# Patient Record
Sex: Male | Born: 2005 | Race: Black or African American | Hispanic: No | Marital: Single
Health system: Midwestern US, Community
[De-identification: ages and names within clinical notes are randomized; demographics above are authoritative.]

## PROBLEM LIST (undated history)

## (undated) DIAGNOSIS — M5416 Radiculopathy, lumbar region: Secondary | ICD-10-CM

## (undated) DIAGNOSIS — M5116 Intervertebral disc disorders with radiculopathy, lumbar region: Secondary | ICD-10-CM

## (undated) DIAGNOSIS — R5600 Simple febrile convulsions: Secondary | ICD-10-CM

---
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
## 2023-02-13 NOTE — Progress Notes (Signed)
CARE TEAM:  Patient Care Team:  Wilms, Isca, MD as PCP - General (Pediatrics)    ASSESSMENT and PLAN  1. Lumbar spine pain       There is no problem list on file for this patient.    Impression/Plan:  17 year old male with a right lower extremity radiculopathy for 8 months who has had 2 epidural steroid injections with temporary relief for 1-2 days at most.  He continues to have pain shooting down the right lower extremity.  He was unable to participate in the activities that he used to do such as basketball without exacerbating his pain in his lower extremities.  His physical examination is consistent with nerve tension signs in the right lower extremity which also correlates to S1 distribution on the right as well as perhaps L5 distribution on the right.  Given his attempts at conservative management and duration of his symptoms, we will move ahead with planning for right-sided L4-5 and L5-S1 microdiskectomy.  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  I think ignoring the L4-5 level would be inappropriate in his setting.      Orders Placed:   Orders Placed This Encounter   . X-ray lumbar spine complete 4+ views [id number])      Follow-up: No follow-ups on file.    HISTORY OF PRESENT ILLNESS  Chief Complaint: Pain of the Lower Back   Age: 17 y.o.  Sex: male     Previous Spine Injections:  12/29/2022: L4-5 IL ESI: 90% relief for 1-2 days  11016/2023:  Right-sided L4-5, L5-S1 TF ESI: 90% relief for 1-2 days    Prior Spine Surgeries:   None    History of present illness: Mr. Giroux presents today for evaluation of back pain and right lower extremity radiculopathy.  He says these symptoms began when he was lifting weights back in April of 2023.  He noticed at that time immediate onset pain down the right lower extremity.  He was had 2 rounds of epidural steroid injections as described  above each of which provided excellent but very short term pain relief.  He has had extensive physical therapy over the last 8 months without any persistent improvement in his discomfort.  Takes Tylenol and ibuprofen and has had oral steroids without any long-term improvement in his symptoms. He does not have any specific weakness to speak of. The patient denies saddle anesthesia as well as loss of dexterity. The patient denies gait instability.         OBJECTIVE  Constitutional:  No acute distress. His body mass index is 18.82 kg/m.   Respiratory:  No labored breathing.  Cardiovascular:  No marked edema.  Psychiatric: Alert and oriented x3.      Physical Exam   General appearance: Well-groomed  Body Habitus: Underweight  Psych: Alert, Oriented and Pleasant  Respiratory Exam: Non-labored breathing  Cardiovascular Exam: Extremities warm and well perfused  Right tibial pulse: Normal  Left tibial pulse: Normal    Lumbar Exam    Skin: Normal  Cervical posture: Normal  Thoracic posture: Normal  Lumbar Posture: Normal  Pelvic Tilt: Normal  Sagittal Balance: Normal  Coronal Balance: Normal  Lumbar Flexion: Moderately limited  Lumbar Extension: Mildly limited  Painful lumbar range of motion: Not noted  Left Hip Motion: No pain with internal rotation and No pain with external rotation  Right Hip Motion: No  pain with internal rotation and No pain with external rotation  Lumbar Point of Tenderness: No Tenderness Noted  Thoracic Tenderness Location: Not Noted    Neurologic Exam   BLE Motor: Normal  R Hip Flexion: 5/5  L Hip Flexion: 5/5  R Knee Extension: 5/5  L Knee Extension: 5/5  R Ankle Dorsiflexion: 5/5  L Ankle Dorsiflexion: 5/5  R Great Toe Extension: 5/5  L Great Toe Extension: 5/5  R Ankle Plantar Flexion: 5/5  L Ankle Plantar Flexion: 5/5  R Ankle Inversion: 5/5  L Ankle Inversion: 5/5  R Ankle Eversion: 5/5  L Ankle Eversion: 5/5    BLE Sensory: Normal  R Lateral Thigh: Normal  L Lateral Thigh: Normal  R Anterior  Thigh: Normal  L Anterior thigh: Normal  R Shin: Normal  L Shin: Normal  R Calf: Normal  L Calf: Normal  R Dorsal Foot: Normal  L Dorsal Foot: Normal  R Lateral Foot: Normal  L Lateral Foot: Normal  R Plantar Foot: Normal  L Plantar Foot: Normal  R Toes: Normal  L Toes: Normal    BLE Reflexes: Normal  R Patellar Reflex: 2+  L Patellar Reflex: 2+  R Achilles Reflex: 1+  L Achilles Reflex: 1+  R Ankle Jerk:  Normal  L Ankle Jerk: Normal     Right straight leg raise positive   Left straight leg raise negative  R Contralateral SLR: Straightening of the right leg causes left leg pain   L Contralateral SLR: Straightening of the left leg does not cause right leg pain     Gait Evaluation  Gait: Normal  Right-sided gait: Normal  Left-sided gait: Normal  Toe raises: Both sides  Heel-Standing: Both sides  Tandem Walking:Normal  Assistive Device: None      IMAGING / DIAGNOSTIC STUDIES   Order: XR SPINE LUMBAR COMPLETE 4+ VW - Indication: Lumbar spine pain      X-ray lumbar spine complete 4+ views [id number])    Result Date: 02/13/2023  AP, Lat, Flexion, Extension.     Impression: AP lateral flexion-extension lumbar spine radiographs demonstrate stable appearance of the lumbar spine.  He does have relatively low pelvic incidence and appropriately low curvature and lordosis of the lumbar spine.  There was no obvious pars defect or evidence of instability on his radiographs.     Additional Imaging Reviewed:  MRI of the lumbar spine from October 04, 2022 was also reviewed:  This demonstrates disc desiccation and disc height loss most significant at L4-5 and L5-S1.  At the L4-5 level, he has right-sided paracentral disc herniation contributing to moderate right-sided lateral recess stenosis adjacent to the traversing L5 nerve root.  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    PROCEDURES  Procedures      Zollie Pee, MD

## 2023-02-13 NOTE — H&P (View-Only) (Signed)
CARE TEAM:  Patient Care Team:  Wilms, Isca, MD as PCP - General (Pediatrics)    ASSESSMENT and PLAN  1. Lumbar spine pain       There is no problem list on file for this patient.    Impression/Plan:  17 year old male with a right lower extremity radiculopathy for 8 months who has had 2 epidural steroid injections with temporary relief for 1-2 days at most.  He continues to have pain shooting down the right lower extremity.  He was unable to participate in the activities that he used to do such as basketball without exacerbating his pain in his lower extremities.  His physical examination is consistent with nerve tension signs in the right lower extremity which also correlates to S1 distribution on the right as well as perhaps L5 distribution on the right.  Given his attempts at conservative management and duration of his symptoms, we will move ahead with planning for right-sided L4-5 and L5-S1 microdiskectomy.  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  I think ignoring the L4-5 level would be inappropriate in his setting.      Orders Placed:   Orders Placed This Encounter   . X-ray lumbar spine complete 4+ views [id number])      Follow-up: No follow-ups on file.    HISTORY OF PRESENT ILLNESS  Chief Complaint: Pain of the Lower Back   Age: 17 y.o.  Sex: male     Previous Spine Injections:  12/29/2022: L4-5 IL ESI: 90% relief for 1-2 days  11016/2023:  Right-sided L4-5, L5-S1 TF ESI: 90% relief for 1-2 days    Prior Spine Surgeries:   None    History of present illness: Mr. Patrick George presents today for evaluation of back pain and right lower extremity radiculopathy.  He says these symptoms began when he was lifting weights back in April of 2023.  He noticed at that time immediate onset pain down the right lower extremity.  He was had 2 rounds of epidural steroid injections as described  above each of which provided excellent but very short term pain relief.  He has had extensive physical therapy over the last 8 months without any persistent improvement in his discomfort.  Takes Tylenol and ibuprofen and has had oral steroids without any long-term improvement in his symptoms. He does not have any specific weakness to speak of. The patient denies saddle anesthesia as well as loss of dexterity. The patient denies gait instability.         OBJECTIVE  Constitutional:  No acute distress. His body mass index is 18.82 kg/m.   Respiratory:  No labored breathing.  Cardiovascular:  No marked edema.  Psychiatric: Alert and oriented x3.      Physical Exam   General appearance: Well-groomed  Body Habitus: Underweight  Psych: Alert, Oriented and Pleasant  Respiratory Exam: Non-labored breathing  Cardiovascular Exam: Extremities warm and well perfused  Right tibial pulse: Normal  Left tibial pulse: Normal    Lumbar Exam    Skin: Normal  Cervical posture: Normal  Thoracic posture: Normal  Lumbar Posture: Normal  Pelvic Tilt: Normal  Sagittal Balance: Normal  Coronal Balance: Normal  Lumbar Flexion: Moderately limited  Lumbar Extension: Mildly limited  Painful lumbar range of motion: Not noted  Left Hip Motion: No pain with internal rotation and No pain with external rotation  Right Hip Motion: No  pain with internal rotation and No pain with external rotation  Lumbar Point of Tenderness: No Tenderness Noted  Thoracic Tenderness Location: Not Noted    Neurologic Exam   BLE Motor: Normal  R Hip Flexion: 5/5  L Hip Flexion: 5/5  R Knee Extension: 5/5  L Knee Extension: 5/5  R Ankle Dorsiflexion: 5/5  L Ankle Dorsiflexion: 5/5  R Great Toe Extension: 5/5  L Great Toe Extension: 5/5  R Ankle Plantar Flexion: 5/5  L Ankle Plantar Flexion: 5/5  R Ankle Inversion: 5/5  L Ankle Inversion: 5/5  R Ankle Eversion: 5/5  L Ankle Eversion: 5/5    BLE Sensory: Normal  R Lateral Thigh: Normal  L Lateral Thigh: Normal  R Anterior  Thigh: Normal  L Anterior thigh: Normal  R Shin: Normal  L Shin: Normal  R Calf: Normal  L Calf: Normal  R Dorsal Foot: Normal  L Dorsal Foot: Normal  R Lateral Foot: Normal  L Lateral Foot: Normal  R Plantar Foot: Normal  L Plantar Foot: Normal  R Toes: Normal  L Toes: Normal    BLE Reflexes: Normal  R Patellar Reflex: 2+  L Patellar Reflex: 2+  R Achilles Reflex: 1+  L Achilles Reflex: 1+  R Ankle Jerk:  Normal  L Ankle Jerk: Normal     Right straight leg raise positive   Left straight leg raise negative  R Contralateral SLR: Straightening of the right leg causes left leg pain   L Contralateral SLR: Straightening of the left leg does not cause right leg pain     Gait Evaluation  Gait: Normal  Right-sided gait: Normal  Left-sided gait: Normal  Toe raises: Both sides  Heel-Standing: Both sides  Tandem Walking:Normal  Assistive Device: None      IMAGING / DIAGNOSTIC STUDIES   Order: XR SPINE LUMBAR COMPLETE 4+ VW - Indication: Lumbar spine pain      X-ray lumbar spine complete 4+ views [id number])    Result Date: 02/13/2023  AP, Lat, Flexion, Extension.     Impression: AP lateral flexion-extension lumbar spine radiographs demonstrate stable appearance of the lumbar spine.  He does have relatively low pelvic incidence and appropriately low curvature and lordosis of the lumbar spine.  There was no obvious pars defect or evidence of instability on his radiographs.     Additional Imaging Reviewed:  MRI of the lumbar spine from October 04, 2022 was also reviewed:  This demonstrates disc desiccation and disc height loss most significant at L4-5 and L5-S1.  At the L4-5 level, he has right-sided paracentral disc herniation contributing to moderate right-sided lateral recess stenosis adjacent to the traversing L5 nerve root.  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    PROCEDURES  Procedures      Zollie Pee, MD

## 2023-02-15 ENCOUNTER — Inpatient Hospital Stay: Admit: 2023-02-15 | Discharge: 2023-02-20 | Payer: MEDICAID

## 2023-02-15 DIAGNOSIS — Z01818 Encounter for other preprocedural examination: Secondary | ICD-10-CM

## 2023-02-15 LAB — URINALYSIS WITH REFLEX TO CULTURE
BACTERIA, URINE: NEGATIVE /hpf
Bilirubin Urine: NEGATIVE
Blood, Urine: NEGATIVE
Glucose, UA: NEGATIVE mg/dL
Leukocyte Esterase, Urine: NEGATIVE
Nitrite, Urine: NEGATIVE
Protein, UA: NEGATIVE mg/dL
Specific Gravity, UA: 1.03 (ref 1.003–1.030)
Urobilinogen, Urine: 1 EU/dL (ref 0.2–1.0)
pH, Urine: 5.5 (ref 5.0–8.0)

## 2023-02-15 LAB — CBC WITH AUTO DIFFERENTIAL
Absolute Immature Granulocyte: 0 10*3/uL (ref 0.00–0.04)
Basophils %: 1 % (ref 0–1)
Basophils Absolute: 0 10*3/uL (ref 0.0–0.1)
Eosinophils %: 3 % (ref 0–7)
Eosinophils Absolute: 0.2 10*3/uL (ref 0.0–0.4)
Hematocrit: 47 % (ref 36.6–50.3)
Hemoglobin: 16.3 g/dL (ref 12.1–17.0)
Immature Granulocytes: 0 % (ref 0.0–0.5)
Lymphocytes %: 54 % — ABNORMAL HIGH (ref 12–49)
Lymphocytes Absolute: 3.5 10*3/uL (ref 0.8–3.5)
MCH: 30.1 PG (ref 26.0–34.0)
MCHC: 34.7 g/dL (ref 30.0–36.5)
MCV: 86.9 FL (ref 80.0–99.0)
MPV: 9.1 FL (ref 8.9–12.9)
Monocytes %: 7 % (ref 5–13)
Monocytes Absolute: 0.4 10*3/uL (ref 0.0–1.0)
Neutrophils %: 35 % (ref 32–75)
Neutrophils Absolute: 2.2 10*3/uL (ref 1.8–8.0)
Nucleated RBCs: 0 PER 100 WBC
Platelets: 300 10*3/uL (ref 150–400)
RBC: 5.41 M/uL (ref 4.10–5.70)
RDW: 12.7 % (ref 11.5–14.5)
WBC: 6.4 10*3/uL (ref 4.1–11.1)
nRBC: 0 10*3/uL (ref 0.00–0.01)

## 2023-02-15 LAB — COMPREHENSIVE METABOLIC PANEL
ALT: 15 U/L (ref 12–78)
AST: 13 U/L — ABNORMAL LOW (ref 15–37)
Albumin/Globulin Ratio: 1.1 (ref 1.1–2.2)
Albumin: 3.9 g/dL (ref 3.5–5.0)
Alk Phosphatase: 72 U/L (ref 60–330)
Anion Gap: 2 mmol/L — ABNORMAL LOW (ref 5–15)
BUN: 13 MG/DL (ref 6–20)
Bun/Cre Ratio: 11 — ABNORMAL LOW (ref 12–20)
CO2: 30 mmol/L (ref 21–32)
Calcium: 9.2 MG/DL (ref 8.5–10.1)
Chloride: 108 mmol/L (ref 97–108)
Creatinine: 1.23 MG/DL (ref 0.70–1.30)
Est, Glom Filt Rate: >60 mL/min/{1.73_m2} (ref >60)
Globulin: 3.4 g/dL (ref 2.0–4.0)
Glucose: 94 mg/dL (ref 65–100)
Potassium: 4.2 mmol/L (ref 3.5–5.1)
Sodium: 140 mmol/L (ref 136–145)
Total Bilirubin: 1 MG/DL (ref 0.2–1.0)
Total Protein: 7.3 g/dL (ref 6.4–8.2)

## 2023-02-15 LAB — PROTIME-INR
INR: 1 (ref 0.9–1.1)
Protime: 10.9 s (ref 9.0–11.1)

## 2023-02-15 LAB — APTT: APTT: 28.1 s (ref 22.1–31.0)

## 2023-02-15 NOTE — H&P (Cosign Needed)
Patrick George was referred for evaluation by:Dr. Gayla Medicus for Pre- Op Evaluation.  Please see encounter details and orders for consultative summary.    Type of surgery : Right L4-L5, L5-S1 Microdiscectomy  Surgery site : Lumbar spine  Anesthesia type: General  Date of procedure:  02/21/2023    This 17 y.o. year old male presents with complaints of lower back pian with radiation of pain down right leg since 04/2022.  Symptoms began after he was lifting weights and has waxed and waned in intensity since then. MRI showed disc herniation and stenosis at L4-L5 and L5-S1.  Conservative measures including medication management, activity modification, physical therapy and injection therapy have been exhausted prior to the decision for surgery. Patient has discussed the risks, alternatives, and benefits of the surgery with surgeon and has elected to proceed with surgical intervention.    PCP: Dr Carnella Guadalajara Wilms    Allergies: No Known Allergies  Latex allergy: no  Prior reactions to anesthesia:  None     No current outpatient medications on file.     No current facility-administered medications for this encounter.     Past Medical History:   Diagnosis Date    Asthma     childhood asthma    History of anesthesia complications     mother more difficult to sedate    Lumbar radiculitis      Past Surgical History:   Procedure Laterality Date    KNEE ARTHROSCOPY W/ MENISCECTOMY Right 08/2021    WISDOM TOOTH EXTRACTION       Social History     Tobacco Use    Smoking status: Never    Smokeless tobacco: Never   Vaping Use    Vaping Use: Some days    Substances: Nicotine, Flavoring    Devices: Disposable, Pre-filled or refillable cartridge   Substance Use Topics    Alcohol use: Yes     Comment: occasional    Drug use: Yes     Frequency: 7.0 times per week     Types: Marijuana Sheran Fava)     History reviewed. No pertinent family history.     Pre-Operative Risk Assessment Using 2014 ACC/AHA Guidelines     Emergent procedure: No  Active Cardiac  Condition including decompensated HF, Arrhythmia, MI <3 weeks, severe valve disease: No  Risk Level of Procedure:  intermediate risk  Revised Cardiac Risk Index Risk factors: None 3.9% risk of cardiac complications during or around noncardiac surgery (30 day risk of Death, MI or Cardiac arrest).  Measurement of Exercise Tolerance before Surgery >4 METS (climbing > 1 flight of stairs without stopping, walking up hill > 1-2 blocks, scrubbing floors, moving furniture, golf, bowling, dancing or tennis, or running short distance): Yes    History of cardiac (including arrhythmic or valvular) or lung issues? No  Personal or family of bleeding issues?  No  Hx of HTN?  No        Controlled?  N/A     Smoker? No  Etoh or other substance use?  occasional shot of whiskey; smokes cannabis daily     On anticoagulation, insulin, or steroids?  No  Any concern with current medications?  No  Pacemaker present? No        and if so, has it been interrogated in the last 12 months?  N/A  Known OSA?  No   Known liver disease?  No      Blood pressure 115/70, pulse 72, temperature 97.5 F (36.4 C), temperature source  Oral, resp. rate 20, height 1.93 m (6\' 4" ), weight 69.6 kg (153 lb 8 oz), SpO2 100 %.    Review of Systems  Review of Systems   Constitutional:  Negative for chills, fatigue and fever.   HENT:  Negative for congestion, hearing loss, sore throat and trouble swallowing.    Eyes:  Negative for visual disturbance.   Respiratory:  Negative for cough, shortness of breath and wheezing.    Cardiovascular:  Negative for chest pain, palpitations and leg swelling.   Gastrointestinal:  Negative for abdominal pain, blood in stool, nausea and vomiting.   Genitourinary:  Negative for dysuria, frequency and urgency.   Musculoskeletal:  Positive for back pain.   Skin:  Negative for rash and wound.   Neurological:  Positive for weakness (right leg) and numbness (right leg). Negative for dizziness, light-headedness and headaches.    Psychiatric/Behavioral:  Negative for dysphoric mood. The patient is not nervous/anxious.           Physical Exam  Vitals and nursing note reviewed. Exam conducted with a chaperone present.   Constitutional:       Appearance: Normal appearance.   HENT:      Head: Normocephalic and atraumatic.      Right Ear: External ear normal.      Left Ear: External ear normal.      Nose: Nose normal.      Mouth/Throat:      Mouth: Mucous membranes are moist.      Pharynx: Oropharynx is clear.   Eyes:      Extraocular Movements: Extraocular movements intact.   Cardiovascular:      Rate and Rhythm: Normal rate and regular rhythm.      Pulses: Normal pulses.      Heart sounds: Normal heart sounds.   Pulmonary:      Effort: Pulmonary effort is normal.      Breath sounds: Normal breath sounds. No wheezing or rhonchi.   Abdominal:      General: Bowel sounds are normal.      Palpations: Abdomen is soft.      Tenderness: There is no abdominal tenderness.   Musculoskeletal:      Cervical back: Normal range of motion and neck supple.      Lumbar back: Decreased range of motion.   Skin:     General: Skin is warm and dry.      Findings: No lesion or rash.   Neurological:      General: No focal deficit present.      Mental Status: He is alert and oriented to person, place, and time.      Motor: Weakness present.      Comments: Right leg weakness noted   Psychiatric:         Mood and Affect: Mood normal.         Behavior: Behavior normal.       Assessment  Lumbar disc Herniation  Preoperative evaluation    Plan  Labs and EKG pending  Plan for Right L4-L5, L5-S1 Microdiscectomy    The purpose of this visit is for preoperative history and physical and does not imply medical clearance for surgical procedure.  Additional clearance from specialists may be required based on findings from this examination.    According to the 2014 ACC/AHA pre-operative risk assessment guidelines Oleh Keath is at low risk for major cardiac complications during  a intermediate procedure, exercise tolerance is >4 METS.    Request further recommendations  from consultants: None

## 2023-02-15 NOTE — Other (Signed)
Clarification requested regarding patient's surgery. Surgical posting and surgical consent state "R L4-L5". Left message for surgeon's office to clarify.

## 2023-02-15 NOTE — Other (Signed)
[place] [address]   MAIN OR                                  [id number]   MAIN PRE OP                          [id number]                                                                                AMBULATORY PRE OP          [phone]  PRE-ADMISSION TESTING    [phone]     Surgery Date:  Tuesday 02/21/2023       Is surgery arrival time given by surgeon?    If "NO", [facility] staff will call you between 3 and 7pm the day before your surgery with your arrival time.   Call [phone] after 7pm Monday-Friday if you did not receive this call.    INSTRUCTIONS BEFORE YOUR SURGERY   When You  Arrive Free valet parking is available from 7:00 AM - 5:00 PM.  Arrive at the [facility] [facility] on the day of your surgery  Have your insurance card, photo ID, and any copayment (if needed)   Food   and   Drink NO food or drink after midnight the night before surgery    This means NO water, gum, mints, coffee, juice, etc.  No alcohol (beer, wine, liquor) 24 hours before and after surgery   Medications to   TAKE   Morning of Surgery MEDICATIONS TO TAKE THE MORNING OF SURGERY WITH A SIP OF WATER:     N/A    Tylenol may be taken as needed.   Medications  To  STOP      7 days before surgery Non-Steroidal anti-inflammatory Drugs (NSAID's): for example, Ibuprofen (Advil, Motrin), Naproxen (Aleve)  Aspirin, if taking for pain   Herbal supplements, vitamins, and fish oil  Other:     Blood  Thinners If you take  Aspirin, Plavix, Coumadin, or any blood-thinning or anti-blood clot medicine, talk to the doctor who prescribed the medications for pre-operative instructions.   Bathing Clothing  Jewelry  Valuables     If you shower the morning of surgery, please do not apply anything to your skin (lotions, powders, deodorant, or makeup, especially mascara)  Follow Chlorhexidine [employer] body wash instructions provided to you during PAT  appointment. Begin 3 days prior to surgery.  Do not shave or trim anywhere 24 hours before surgery  Wear your hair loose or down; no pony-tails, buns, or metal hair clips  Wear loose, comfortable, clean clothes  Wear glasses instead of contacts  Leave money, valuables, and jewelry, including body piercings, at home  If you were given an [employer], bring it on day of surgery.   Going Home - or Spending the Night   SAME-DAY SURGERY:  You must have a responsible adult drive you home and stay with you 24 hours after surgery    ADMITS: If your doctor is keeping you in the hospital after surgery, leave personal belongings/luggage in your car until you have a hospital room number.       Special Instructions Watch online Spine Surgery education video.    If your physical condition changes (like a fever, cold, flu, etc.) call your surgeon.  If a situation occurs and you are delayed the day of surgery, call [phone].         Follow all instructions so your surgery won't be cancelled.  Please, be on time.                  Home medication(s) reviewed and verified verbally with list during PAT appointment.

## 2023-02-15 NOTE — Other (Signed)
Vitamin D was low - 10.1, per Luane School, NP, instructions were given to Mother of Patrick George to get OTC Vitamin D 5,000 units, take two capsules daily for 30 days.  Mom verbalized understanding.   I did tell her that the MRSA result is not posted yet, and that if that is positive, then we would be calling her back to treat that, but since his surgery is in the near future, I wanted [name] to get started with the Vit. D supplement.      I also sent the result of Vit D to Dr. Doy Mince and Dr. Lavella Lemons Wilms.    The EKG was somewhat abnormal, that was printed and faxed to Dr. Wilms for future information/patient chart.  Luane School, NP was aware that I was sending EKG to patient's PCP.

## 2023-02-16 LAB — HEMOGLOBIN A1C
Estimated Avg Glucose: 105 mg/dL
Hemoglobin A1C: 5.3 % (ref 4.0–5.6)

## 2023-02-16 LAB — EKG 12-LEAD
Atrial Rate: 56 {beats}/min
P Axis: 70 degrees
P-R Interval: 216 ms
Q-T Interval: 404 ms
QRS Duration: 94 ms
QTc Calculation (Bazett): 389 ms
R Axis: 81 degrees
T Axis: 69 degrees
Ventricular Rate: 56 {beats}/min

## 2023-02-16 LAB — TYPE AND SCREEN
ABO/Rh: O POS
Antibody Screen: NEGATIVE

## 2023-02-16 LAB — VITAMIN D 25 HYDROXY: Vit D, 25-Hydroxy: 10.1 ng/mL — ABNORMAL LOW (ref 30–100)

## 2023-02-17 LAB — CULTURE, MRSA, SCREENING

## 2023-02-21 ENCOUNTER — Ambulatory Visit: Admit: 2023-02-21 | Payer: MEDICAID

## 2023-02-21 ENCOUNTER — Inpatient Hospital Stay: Payer: Medicaid (Managed Care) | Attending: Student in an Organized Health Care Education/Training Program

## 2023-02-21 MED ORDER — MIDAZOLAM HCL (PF) 2 MG/2ML IJ SOLN
2 | Freq: Once | INTRAMUSCULAR | Status: DC | PRN
Start: 2023-02-21 — End: 2023-02-21

## 2023-02-21 MED ORDER — PHENYLEPHRINE HCL (PRESSORS) 0.4 MG/10ML IV SOSY
0.4 | INTRAVENOUS | Status: AC
Start: 2023-02-21 — End: ?

## 2023-02-21 MED ORDER — ONDANSETRON HCL 4 MG/2ML IJ SOLN
4 | Freq: Once | INTRAMUSCULAR | Status: DC | PRN
Start: 2023-02-21 — End: 2023-02-21

## 2023-02-21 MED ORDER — NEOSTIGMINE METHYLSULFATE 3 MG/3ML IV SOSY
3 | INTRAVENOUS | Status: AC
Start: 2023-02-21 — End: ?

## 2023-02-21 MED ORDER — LACTATED RINGERS IV SOLN
INTRAVENOUS | Status: DC
Start: 2023-02-21 — End: 2023-02-21
  Administered 2023-02-21 (×2): via INTRAVENOUS

## 2023-02-21 MED ORDER — ONDANSETRON HCL 4 MG PO TABS
4 | ORAL_TABLET | Freq: Three times a day (TID) | ORAL | 0 refills | Status: AC | PRN
Start: 2023-02-21 — End: ?
  Filled 2023-02-21: qty 8, 8d supply, fill #0

## 2023-02-21 MED ORDER — BUPIVACAINE LIPOSOME 1.3 % IJ SUSP
1.3 | INTRAMUSCULAR | Status: AC
Start: 2023-02-21 — End: ?

## 2023-02-21 MED ORDER — SUCCINYLCHOLINE CHLORIDE 100 MG/5ML IV SOSY
100 | INTRAVENOUS | Status: AC
Start: 2023-02-21 — End: ?

## 2023-02-21 MED ORDER — GLYCOPYRROLATE 0.2 MG/ML IJ SOLN
0.2 MG/ML | INTRAMUSCULAR | Status: DC | PRN
  Administered 2023-02-21: 15:00:00 .4 via INTRAVENOUS

## 2023-02-21 MED ORDER — DEXAMETHASONE SODIUM PHOSPHATE 4 MG/ML IJ SOLN
4 MG/ML | INTRAMUSCULAR | Status: DC | PRN
  Administered 2023-02-21: 13:00:00 8 via INTRAVENOUS

## 2023-02-21 MED ORDER — NEOSTIGMINE METHYLSULFATE 3 MG/3ML IV SOSY
3 MG/3ML | INTRAVENOUS | Status: DC | PRN
  Administered 2023-02-21: 15:00:00 3 via INTRAVENOUS

## 2023-02-21 MED ORDER — DIPHENHYDRAMINE HCL 50 MG/ML IJ SOLN
50 | Freq: Once | INTRAMUSCULAR | Status: DC | PRN
Start: 2023-02-21 — End: 2023-02-21

## 2023-02-21 MED ORDER — LACTATED RINGERS IV SOLN
INTRAVENOUS | Status: DC
Start: 2023-02-21 — End: 2023-02-21

## 2023-02-21 MED ORDER — LIDOCAINE HCL (PF) 1 % IJ SOLN
1 | Freq: Once | INTRAMUSCULAR | Status: DC | PRN
Start: 2023-02-21 — End: 2023-02-21

## 2023-02-21 MED ORDER — LIDOCAINE HCL (PF) 2 % IJ SOLN
2 | INTRAMUSCULAR | Status: AC
Start: 2023-02-21 — End: ?

## 2023-02-21 MED ORDER — PROPOFOL 200 MG/20ML IV EMUL
200 | INTRAVENOUS | Status: AC
Start: 2023-02-21 — End: ?

## 2023-02-21 MED ORDER — FENTANYL CITRATE (PF) 100 MCG/2ML IJ SOLN
100 | Freq: Once | INTRAMUSCULAR | Status: DC | PRN
Start: 2023-02-21 — End: 2023-02-21

## 2023-02-21 MED ORDER — PROPOFOL 500 MG/50ML IV EMUL
500 | INTRAVENOUS | Status: AC
Start: 2023-02-21 — End: ?

## 2023-02-21 MED ORDER — DEXMEDETOMIDINE HCL 200 MCG/2ML IV SOLN
200 | INTRAVENOUS | Status: AC
Start: 2023-02-21 — End: ?

## 2023-02-21 MED ORDER — DEXAMETHASONE SODIUM PHOSPHATE 4 MG/ML IJ SOLN
4 | INTRAMUSCULAR | Status: AC
Start: 2023-02-21 — End: ?

## 2023-02-21 MED ORDER — SENNOSIDES 8.6 MG PO TABS
8.6 | ORAL_TABLET | Freq: Two times a day (BID) | ORAL | 0 refills | Status: AC
Start: 2023-02-21 — End: 2023-02-28
  Filled 2023-02-21: qty 14, 7d supply, fill #0

## 2023-02-21 MED ORDER — FENTANYL CITRATE (PF) 250 MCG/5ML IJ SOLN
250 | INTRAMUSCULAR | Status: AC
Start: 2023-02-21 — End: ?

## 2023-02-21 MED ORDER — GLYCOPYRROLATE 0.2 MG/ML IJ SOLN
0.2 | INTRAMUSCULAR | Status: AC
Start: 2023-02-21 — End: ?

## 2023-02-21 MED ORDER — CYCLOBENZAPRINE HCL 5 MG PO TABS
5 | ORAL_TABLET | Freq: Two times a day (BID) | ORAL | 0 refills | Status: AC | PRN
Start: 2023-02-21 — End: 2023-02-26
  Filled 2023-02-21: qty 10, 5d supply, fill #0

## 2023-02-21 MED ORDER — BUPIVACAINE HCL (PF) 0.5 % IJ SOLN
0.5 | INTRAMUSCULAR | Status: DC | PRN
Start: 2023-02-21 — End: 2023-02-21
  Administered 2023-02-21: 15:00:00 50

## 2023-02-21 MED ORDER — BUPIVACAINE HCL (PF) 0.5 % IJ SOLN
0.5 | INTRAMUSCULAR | Status: AC
Start: 2023-02-21 — End: ?

## 2023-02-21 MED ORDER — ONDANSETRON HCL 4 MG/2ML IJ SOLN
4 MG/2ML | INTRAMUSCULAR | Status: DC | PRN
  Administered 2023-02-21: 15:00:00 4 via INTRAVENOUS

## 2023-02-21 MED ORDER — ROCURONIUM BROMIDE 50 MG/5ML IV SOLN
50 MG/5ML | INTRAVENOUS | Status: DC | PRN
  Administered 2023-02-21: 14:00:00 20 via INTRAVENOUS
  Administered 2023-02-21: 12:00:00 10 via INTRAVENOUS
  Administered 2023-02-21: 13:00:00 40 via INTRAVENOUS

## 2023-02-21 MED ORDER — LIDOCAINE HCL (PF) 2 % IJ SOLN
2 % | INTRAMUSCULAR | Status: DC | PRN
  Administered 2023-02-21: 12:00:00 60 via INTRAVENOUS

## 2023-02-21 MED ORDER — ROCURONIUM BROMIDE 50 MG/5ML IV SOLN
50 | INTRAVENOUS | Status: AC
Start: 2023-02-21 — End: ?

## 2023-02-21 MED ORDER — STERILE WATER FOR INJECTION (MIXTURES ONLY)
2 | Freq: Once | Status: AC
Start: 2023-02-21 — End: 2023-02-21
  Administered 2023-02-21: 13:00:00 2000 mg via INTRAVENOUS

## 2023-02-21 MED ORDER — HYDROMORPHONE 0.5MG/0.5ML IJ SOLN
1 | Status: DC | PRN
Start: 2023-02-21 — End: 2023-02-21
  Administered 2023-02-21: 16:00:00 0.5 mg via INTRAVENOUS

## 2023-02-21 MED ORDER — EPHEDRINE SULFATE-NACL 50-0.9 MG/5ML-% IV SOSY
INTRAVENOUS | Status: AC
Start: 2023-02-21 — End: ?

## 2023-02-21 MED ORDER — PROPOFOL 200 MG/20ML IV EMUL
200 MG/20ML | INTRAVENOUS | Status: DC | PRN
  Administered 2023-02-21: 12:00:00 200 via INTRAVENOUS

## 2023-02-21 MED ORDER — SUCCINYLCHOLINE CHLORIDE 100 MG/5ML IV SOSY
100 MG/5ML | INTRAVENOUS | Status: DC | PRN
  Administered 2023-02-21: 12:00:00 160 via INTRAVENOUS

## 2023-02-21 MED ORDER — MIDAZOLAM HCL 5 MG/5ML IJ SOLN
5 MG/5ML | INTRAMUSCULAR | Status: DC | PRN
  Administered 2023-02-21: 12:00:00 3 via INTRAVENOUS
  Administered 2023-02-21: 12:00:00 2 via INTRAVENOUS

## 2023-02-21 MED ORDER — MIDAZOLAM HCL 5 MG/5ML IJ SOLN
5 | INTRAMUSCULAR | Status: AC
Start: 2023-02-21 — End: ?

## 2023-02-21 MED ORDER — HYDROMORPHONE 0.5MG/0.5ML IJ SOLN
1 | Status: DC | PRN
Start: 2023-02-21 — End: 2023-02-21

## 2023-02-21 MED ORDER — PHENYLEPHRINE HCL (PRESSORS) 0.4 MG/10ML IV SOSY
0.4 MG/10ML | INTRAVENOUS | Status: DC | PRN
  Administered 2023-02-21: 13:00:00 80 via INTRAVENOUS

## 2023-02-21 MED ORDER — HYDROCODONE-ACETAMINOPHEN 5-325 MG PO TABS
5-325 | ORAL_TABLET | ORAL | 0 refills | Status: AC | PRN
Start: 2023-02-21 — End: 2023-02-26

## 2023-02-21 MED ORDER — ONDANSETRON HCL 4 MG/2ML IJ SOLN
4 | INTRAMUSCULAR | Status: AC
Start: 2023-02-21 — End: ?

## 2023-02-21 MED ORDER — FENTANYL CITRATE (PF) 250 MCG/5ML IJ SOLN
250 MCG/5ML | INTRAMUSCULAR | Status: DC | PRN
  Administered 2023-02-21 (×2): 50 via INTRAVENOUS
  Administered 2023-02-21: 12:00:00 150 via INTRAVENOUS

## 2023-02-21 MED FILL — DIPRIVAN 500 MG/50ML IV EMUL: 500 MG/50ML | INTRAVENOUS | Qty: 50

## 2023-02-21 MED FILL — SUCCINYLCHOLINE CHLORIDE 100 MG/5ML IV SOSY: 100 MG/5ML | INTRAVENOUS | Qty: 5

## 2023-02-21 MED FILL — DEXMEDETOMIDINE HCL 200 MCG/2ML IV SOLN: 200 MCG/2ML | INTRAVENOUS | Qty: 2

## 2023-02-21 MED FILL — LIDOCAINE HCL (PF) 2 % IJ SOLN: 2 % | INTRAMUSCULAR | Qty: 10

## 2023-02-21 MED FILL — PHENYLEPHRINE HCL (PRESSORS) 0.4 MG/10ML IV SOSY: 0.4 MG/10ML | INTRAVENOUS | Qty: 10

## 2023-02-21 MED FILL — ROCURONIUM BROMIDE 50 MG/5ML IV SOLN: 50 MG/5ML | INTRAVENOUS | Qty: 5

## 2023-02-21 MED FILL — LACTATED RINGERS IV SOLN: INTRAVENOUS | Qty: 1000

## 2023-02-21 MED FILL — BUPIVACAINE HCL (PF) 0.5 % IJ SOLN: 0.5 % | INTRAMUSCULAR | Qty: 30

## 2023-02-21 MED FILL — DEXAMETHASONE SODIUM PHOSPHATE 4 MG/ML IJ SOLN: 4 MG/ML | INTRAMUSCULAR | Qty: 1

## 2023-02-21 MED FILL — DIPRIVAN 200 MG/20ML IV EMUL: 200 MG/20ML | INTRAVENOUS | Qty: 20

## 2023-02-21 MED FILL — EPHEDRINE SULFATE-NACL 50-0.9 MG/5ML-% IV SOSY: INTRAVENOUS | Qty: 5

## 2023-02-21 MED FILL — MIDAZOLAM HCL 5 MG/5ML IJ SOLN: 5 MG/ML | INTRAMUSCULAR | Qty: 5

## 2023-02-21 MED FILL — EXPAREL 1.3 % IJ SUSP: 1.3 % | INTRAMUSCULAR | Qty: 20

## 2023-02-21 MED FILL — ONDANSETRON HCL 4 MG/2ML IJ SOLN: 4 MG/2ML | INTRAMUSCULAR | Qty: 2

## 2023-02-21 MED FILL — NEOSTIGMINE METHYLSULFATE 3 MG/3ML IV SOSY: 3 MG/ML | INTRAVENOUS | Qty: 3

## 2023-02-21 MED FILL — GLYCOPYRROLATE 0.2 MG/ML IJ SOLN: 0.2 MG/ML | INTRAMUSCULAR | Qty: 1

## 2023-02-21 MED FILL — FENTANYL CITRATE (PF) 250 MCG/5ML IJ SOLN: 250 MCG/5ML | INTRAMUSCULAR | Qty: 5

## 2023-02-21 MED FILL — CEFAZOLIN SODIUM 2 G IJ SOLR: 2 g | INTRAMUSCULAR | Qty: 2000

## 2023-02-21 MED FILL — HYDROMORPHONE HCL 1 MG/ML IJ SOLN: 1 MG/ML | INTRAMUSCULAR | Qty: 0.5

## 2023-02-21 MED FILL — HYDROCODONE-ACETAMINOPHE 5-325 TABS: 5-325 5-325 MG | ORAL | 5 days supply | Qty: 20 | Fill #0 | Status: AC

## 2023-02-21 NOTE — Discharge Instructions (Signed)
Low Back Surgery Discharge Instructions    Activities  You are going home a well person, be as active as possible.  Your only exercise should be walking. Start with short frequent walks and increase your walking distance each day. Start with walking twice a day for 5 minutes and increase your distance each day 2-3 minutes until you reach 25 minutes twice a day. Limit the amount of time you sit to 20-30 minute intervals.  Sitting for prolonged periods of time will be uncomfortable for you following your surgery.    Do not lift anything over five pounds, and do not do any bending or straining.     Avoid reaching overhead in this post-operative period  Do not do any back exercises until you have been instructed by your doctor.    Try to avoid any repetitive bending, lifting or twisting motions.   You may sleep in any position that is comfortable.  Continue using your incentive spirometer regularly for deep breathing exercises    Diet  You may resume your normal diet.  If your throat is sore, you may want to eat soft foods for a few days.  Be sure to drink plenty of fluids, it is important to keep yourself hydrated.   Avoid alcoholic beverages and ABSOLUTELY NO tobacco products. Tobacco products will interfere with your healing. If you continue to use tobacco, you are more likely to have a poor outcome and need another surgery in the future.  Make sure you are getting a well balanced, healthy diet as this will help with recovery.    Medications  Take your pain medication as directed.  Do NOT take additional Tylenol if your prescribed pain medication has acetaminophen in it (Endocet/Percocet, Lortab, Norco).   It is important to have regular bowel movements.  Pain medications may cause constipation.  Stool softeners, prune juice, and increasing your water and fiber intake may help in preventing constipation.  Do NOT take laxatives if at all possible except in severe situations. It can results in a vicious cycle of  constipation and diarrhea.   Do not be alarmed if you still have some of the same symptoms you had prior to surgery. The nerves often require time to heal after the pressure has been relieved. You may experience pain in your shoulders or between your shoulder blades, which is common after this surgery. The level of pain you experience should improve as your body heals.    You may take anti-inflammatories (NSAIDs) after surgery for additional pain relief however you SHOULD NOT take BC Powder or Goody's Powder because these medications can cause postoperative bleeding around the spine     VERY IMPORTANT - PLEASE READ Please monitor the supply of your pain medicine closely and if it looks like you're going to run out of pain medicine over the weekend please call the office on [place] prior to the weekend to request a refill.  The on call physician for nights and weekends CANNOT refill pain medicine.  You will either have to wait until Monday morning when the office re-opens or you will have to go to an urgent care/ emergency room if you are in need of pain medicine.    Driving  You may not drive or return to work until instructed by your physician.  However, you may ride in the car for short periods of time.    Back Brace  You do not need a back brace following surgery unless specifically told  that you should have one. Some patients wish to wear a snug brace for comfort which is safe to do however you will not be provided with a brace unless there is a specific reason from your surgeon.     Showering  You may shower after surgery as long as the surgical dressing is waterproof. If you feel that the dressing has become wet on the inside, you should replace the surgical dressing with a dry dressing.  Do not rub or apply lotion or ointments to the incision site.  Do not use tub baths, swimming pools or Jacuzzi's, even if the surgical dressing appears to be water proof.     Caring for your incision  Keep the  clear, plastic dressing on until 7 days after surgery. At that point, if the incision is dry and without drainage, you may keep the wound open to air without cover.   You may have steri-strips on your incision (small, white pieces of tape).  Do not pull the steri-strips; they will fall off on their own after several days.  If you have sutures or staples, they will be removed by home health or when you see your physician.  Do not rub or apply any lotions or ointments to your incision site.      Follow Up  You should already have a follow up appointment scheduled at the time of surgery. If you do not have an appointment scheduled, please call the clinic at the number below and schedule an appointment 2 to 3 weeks after your surgery.     Notify your physician if you develop any of the following conditions:  Fever above 101 degrees for 24 hours.  Nausea or vomiting.  Severe headache.  Inability to urinate.  Loss of bowel or bladder control (sudden onset of incontinence).  Changes in sensation in your extremities (numbness, tingling, loss of color).  Severe pain or pain not relieved by medications.  Redness, swelling, or drainage from your incision.  Persistent pain in the chest.   Pain in the calf of either leg.  Increased weakness (if this is greater than before your surgery).      If you have any questions, contact your Orthopaedic Surgeon's office.       [facility]  Office Phone: [phone]  Office Fax: [phone]  [address] [place], [place] [address]

## 2023-02-21 NOTE — Other (Signed)
Discharge instructions given to pt and mom.

## 2023-02-21 NOTE — Op Note (Signed)
Operative Note      Patient: Patrick George  Date of Birth: [birth year]  MRN: [id number]    Date of Procedure: Feb 27, 2023    Pre-Op Diagnosis Codes:     * Lumbar radiculopathy [M54.16]     * Lumbar spondylosis [M47.816]    Post-Op Diagnosis: Same       Procedure(s):  RIGHT L4-5, L5-S1 MICRODISCECTOMY    Surgeon(s):  [name], MD    Assistant:   Surgical Assistant: Redge Gainer    Anesthesia: General    Estimated Blood Loss (mL): less than 50     Complications: None    Specimens:   * No specimens in log *    Implants:  * No implants in log *      Drains:   [REMOVED] Urinary Catheter 02/27/23 2 Way (Removed)       Findings: Moderate right-sided disc herniation prominence at L4-5 and very large right-sided disc herniation at L5-S1        Detailed Description of Procedure:   The patient was met in the preoperative holding area where informed consent was confirmed.  See preoperative H&P for discussion of the risks and benefits.  The patient was then brought back to the operating room and induced under general anesthesia.  A Foley catheter was placed due to the 2 level nature of this procedure.  The patient was then placed on the operating room table on the Wilson frame in a prone position making sure all bony prominences were well-padded.  The low back was then prepped and draped in standard surgical fashion.    A final timeout was held confirming the patient by name date of birth medical record number as well as procedure and levels and laterality of procedure to be performed.  Everyone was in agreement so we proceeded.  We began by using a 18-gauge spinal needle to localize the levels with fluoroscopic imaging.  Once this was completed the midline incision that was 7 cm in length approximately was made.  We dissected down to the level of the fascia which was longitudinally incised to the right of the spinous processes.  We identified the lamina as of L4, L5 as well as S1 and exposed out to the medial border  of the facets on the right side.  We then placed a [facility] elevator under the L4 spinous process.  Another lateral fluoroscopic image was used to confirm the levels.  We then brought in the operating microscope.  Fat was removed over top of the ligamentum flavum and the interlaminar space on the right side at L4-5.  A curette was used to identify the leading edge of L5 and undermine the trailing edge of L4.  With this completed a hemilaminotomy was performed at L4 as well as L5.  The ligamentum flavum was removed in its entirety using combination of nerve hook and Kerrison rongeurs.  Once this was completed the thecal sac and the traversing L5 nerve root was identified.  These 2 are both mobilized medially and a large disc prominence was identified underneath the traversing L5 nerve root which correlated well with the preoperative imaging.  A 15 blade scalpel was used to make an annulotomy.  We then used a nerve hook and pituitary rongeurs to perform a partial discectomy making sure all loose fragments were mobilized.  The disc space was pressurized with sterile saline which did produce some more smaller disc fragments that were loose.  Bipolar electrocautery was used to achieve  hemostasis.  Once this partial discectomy was performed we felt like the prominence was much less so and there is less tension on the traversing L5 nerve root.      We then turned our attention to the L5-S1 level.  Similar to the L4-5 level ligamentum flavum was removed as well as hemilaminotomies performed as described above.  At the L5-S1 level however there was a very large prominence and fullness underneath the traversing S1 nerve root.  The S1 nerve root was mobilized medially to expose a very large disc herniation.  This was entirely entrapped underneath the PLL.  A disc knife was once again used to perform an annulotomy.  Soon as the annulotomy was performed disc material started to come out of the annulotomy.  This tissue was then  further mobilized with a nerve hook and pituitary rongeurs.  Once this partial discectomy was completed hemostasis was achieved with bipolar electrocautery.  We felt like the traversing S1 nerve root was under significantly less tension and it was prior to the discectomy.  We cannot identify any further disc herniation or impingement on the S1 nerve.  Once again we achieved and confirmed hemostasis at the L5-S1 level as well as the L4-5 level which had remained hemostatic.    We then turned our attention to closure.  We irrigated thoroughly with sterile saline.  The wound was then closed in a layered fashion using #1 Vicryl sutures for the fascia followed by 0 Vicryl sutures for the deep fat layer.  2-0 Vicryl suture was used for the dermal layer followed by running 3-0 Monocryl strata fix.  Standard surgical dressing was then applied.  The patient was then transferred to the gurney and transferred to the PACU in stable condition.          Electronically signed by [name], MD on 02/21/2023 at 10:21 AM

## 2023-02-21 NOTE — Anesthesia Post-Procedure Evaluation (Signed)
Department of Anesthesiology  Postprocedure Note    Patient: Patrick George  MRN: [id number]  Stewart: 11/09/2006  Date of evaluation: 02/21/2023    Procedure Summary       Date: 02/21/23 Room / Location: [facility] MA[facility] OR F4 / [facility] MAIN OR    Anesthesia Start: 0720 Anesthesia Stop: [id number]    Procedure: RIGHT L4-5, L5-S1 MICRODISCECTOMY (Spine Lumbar) Diagnosis:       Lumbar radiculopathy      Lumbar spondylosis      (Lumbar radiculopathy [M54.16])      (Lumbar spondylosis [id number])    Surgeons: [name], MD Responsible Provider: Conley Simmonds, MD    Anesthesia Type: General ASA Status: 2            Anesthesia Type: General    Aldrete Phase I: Aldrete Score: 10    Aldrete Phase II:      Anesthesia Post Evaluation    Patient location during evaluation: PACU  Patient participation: complete - patient participated  Level of consciousness: awake  Airway patency: patent  Nausea & Vomiting: no vomiting and no nausea  Cardiovascular status: hemodynamically stable  Respiratory status: acceptable  Hydration status: stable  Pain management: adequate    No notable events documented.

## 2023-02-21 NOTE — Patient Instructions (Signed)
The patient was provided a virtual link to view the pre-operative Spine Class.  A pre-operative Patient education booklet specific to spine surgery was given to the patient in PAT.  The content of the class was presented using an audio power point presentation specific for patients undergoing spine surgery.    Incentive spirometer and CHG bath kits were verbally reviewed.   Day of surgery routine and expectations, hospital routine and expectations, nutrition, alcohol, nicotine, medications, infection control, pain management, DVT precautions and equipment, ice therapy, durable medical equipment, exercises, mobility expectations and precautions, home preparation and safety were reviewed in class.   My contact information was shared with the patient to provide further information as requested by the patient related to their upcoming surgery.   Received confirmation that the patient viewed spine class online via the [facility] pre-op education video survey with quiz.

## 2023-02-21 NOTE — Interval H&P Note (Signed)
Update History & Physical    The patient's History and Physical of February 13, 2023 was reviewed with the patient and I examined the patient. There was no change. The surgical site was confirmed by the patient and me.     Once again we discussed the risks and benefits of surgical intervention.  Risks include but are not limited to scar, pain, infection, wound complication, incomplete resolution of symptoms, damage to surrounding structures such as bones ligaments causing spinal instability.  There is a risk of the need for future surgery.  There is a risk of damage to the nerves themselves.  There is a risk of spinal fluid leak.  The patient rest and understanding of the risk and benefits and elected to proceed.  Surgical plan was right-sided L4-5 and L5-S1 microdiscectomy.    Plan: The risks, benefits, expected outcome, and alternative to the recommended procedure have been discussed with the patient. Patient understands and wants to proceed with the procedure.     Electronically signed by [name], MD on 02/21/2023 at 7:15 AM

## 2023-02-21 NOTE — Anesthesia Pre-Procedure Evaluation (Signed)
Department of Anesthesiology  Preprocedure Note       Name:  Patrick George   Age:  17 y.o.  DOB:  [birth year]                                          MRN:  [id number]         Date:  02/21/2023      Surgeon: Juliann Mule):  [name], MD    Procedure: Procedure(s):  RIGHT L4-5, L5-S1 MICRODISCECTOMY    Medications prior to admission:   Prior to Admission medications    Not on File       Current medications:    No current facility-administered medications for this encounter.       Allergies:  No Known Allergies    Problem List:    Patient Active Problem List   Diagnosis Code   . Lumbar radiculitis M54.16       Past Medical History:        Diagnosis Date   . Asthma     childhood asthma   . History of anesthesia complications     mother more difficult to sedate   . Lumbar radiculitis        Past Surgical History:        Procedure Laterality Date   . KNEE ARTHROSCOPY W/ MENISCECTOMY Right 08/2021   . WISDOM TOOTH EXTRACTION         Social History:    Social History     Tobacco Use   . Smoking status: Never   . Smokeless tobacco: Never   Substance Use Topics   . Alcohol use: Yes     Comment: occasional                                Counseling given: Not Answered      Vital Signs (Current): There were no vitals filed for this visit.                                           BP Readings from Last 3 Encounters:   02/15/23 115/70       NPO Status: Time of last liquid consumption: 2300                        Time of last solid consumption: 2300                        Date of last liquid consumption: 02/20/23                        Date of last solid food consumption: 02/20/23    BMI:   Wt Readings from Last 3 Encounters:   02/15/23 69.6 kg (153 lb 8 oz) (55 %, Z= 0.11)*     * Growth percentiles are based on CDC (Boys, 2-20 Years) data.     There is no height or weight on file to calculate BMI.    CBC:   Lab Results   Component Value Date/Time    WBC 6.4 02/15/2023 03:40 PM    RBC 5.41 02/15/2023 03:40 PM    HGB  16.3  02/15/2023 03:40 PM    HCT 47.0 02/15/2023 03:40 PM    MCV 86.9 02/15/2023 03:40 PM    RDW 12.7 02/15/2023 03:40 PM    PLT 300 02/15/2023 03:40 PM       CMP:   Lab Results   Component Value Date/Time    NA 140 02/15/2023 03:40 PM    K 4.2 02/15/2023 03:40 PM    CL 108 02/15/2023 03:40 PM    CO2 30 02/15/2023 03:40 PM    BUN 13 02/15/2023 03:40 PM    CREATININE 1.23 02/15/2023 03:40 PM    AGRATIO 1.1 02/15/2023 03:40 PM    LABGLOM >60 02/15/2023 03:40 PM    GLUCOSE 94 02/15/2023 03:40 PM    PROT 7.3 02/15/2023 03:40 PM    CALCIUM 9.2 02/15/2023 03:40 PM    BILITOT 1.0 02/15/2023 03:40 PM    ALKPHOS 72 02/15/2023 03:40 PM    AST 13 02/15/2023 03:40 PM    ALT 15 02/15/2023 03:40 PM       POC Tests: No results for input(s): "POCGLU", "POCNA", "POCK", "POCCL", "POCBUN", "POCHEMO", "POCHCT" in the last 72 hours.    Coags:   Lab Results   Component Value Date/Time    PROTIME 10.9 02/15/2023 03:40 PM    INR 1.0 02/15/2023 03:40 PM    APTT 28.1 02/15/2023 03:40 PM       HCG (If Applicable): No results found for: "PREGTESTUR", "PREGSERUM", "HCG", "HCGQUANT"     ABGs: No results found for: "PHART", "PO2ART", "PCO2ART", "HCO3ART", "BEART", "O2SATART"     Type & Screen (If Applicable):  No results found for: "LABABO", "LABRH"    Drug/Infectious Status (If Applicable):  No results found for: "HIV", "HEPCAB"    COVID-19 Screening (If Applicable): No results found for: "COVID19"        Anesthesia Evaluation    Airway: Mallampati: II     Neck ROM: full  Mouth opening: > = 3 FB   Dental: normal exam         Pulmonary: breath sounds clear to auscultation  (+)           asthma:                           ROS comment: Smokes marijuana   Cardiovascular:Negative CV ROS            Rhythm: regular  Rate: normal                    Neuro/Psych:                ROS comment: Lumbar radiculopathy GI/Hepatic/Renal: Neg GI/Hepatic/Renal ROS            Endo/Other: Negative Endo/Other ROS                    Abdominal:             Vascular: negative  vascular ROS.         Other Findings:         Anesthesia Plan      general     ASA 2       Induction: intravenous.    MIPS: Postoperative opioids intended and Prophylactic antiemetics administered.  Anesthetic plan and risks discussed with patient.      Plan discussed with CRNA.  Conley Simmonds, MD   02/21/2023
